# Patient Record
Sex: Female | Born: 1965 | Race: White | Hispanic: No | Marital: Married | Smoking: Former smoker
Health system: Southern US, Community
[De-identification: ages and names within clinical notes are randomized; demographics above are authoritative.]

## PROBLEM LIST (undated history)

## (undated) DIAGNOSIS — C50919 Malignant neoplasm of unspecified site of unspecified female breast: Secondary | ICD-10-CM

## (undated) DIAGNOSIS — K589 Irritable bowel syndrome without diarrhea: Secondary | ICD-10-CM

## (undated) DIAGNOSIS — Z5111 Encounter for antineoplastic chemotherapy: Secondary | ICD-10-CM

## (undated) DIAGNOSIS — D099 Carcinoma in situ, unspecified: Secondary | ICD-10-CM

## (undated) DIAGNOSIS — K219 Gastro-esophageal reflux disease without esophagitis: Secondary | ICD-10-CM

## (undated) DIAGNOSIS — R140 Abdominal distension (gaseous): Secondary | ICD-10-CM

## (undated) DIAGNOSIS — R5081 Fever presenting with conditions classified elsewhere: Secondary | ICD-10-CM

## (undated) DIAGNOSIS — R109 Unspecified abdominal pain: Secondary | ICD-10-CM

## (undated) DIAGNOSIS — K5909 Other constipation: Secondary | ICD-10-CM

## (undated) DIAGNOSIS — C569 Malignant neoplasm of unspecified ovary: Secondary | ICD-10-CM

## (undated) DIAGNOSIS — R160 Hepatomegaly, not elsewhere classified: Secondary | ICD-10-CM

## (undated) DIAGNOSIS — K5792 Diverticulitis of intestine, part unspecified, without perforation or abscess without bleeding: Secondary | ICD-10-CM

## (undated) DIAGNOSIS — R1900 Intra-abdominal and pelvic swelling, mass and lump, unspecified site: Secondary | ICD-10-CM

## (undated) DIAGNOSIS — Z1509 Genetic susceptibility to other malignant neoplasm: Secondary | ICD-10-CM

## (undated) DIAGNOSIS — K5090 Crohn's disease, unspecified, without complications: Secondary | ICD-10-CM

## (undated) DIAGNOSIS — A419 Sepsis, unspecified organism: Secondary | ICD-10-CM

## (undated) DIAGNOSIS — Z1501 Genetic susceptibility to malignant neoplasm of breast: Secondary | ICD-10-CM

## (undated) DIAGNOSIS — Z1502 Genetic susceptibility to malignant neoplasm of ovary: Secondary | ICD-10-CM

## (undated) DIAGNOSIS — D709 Neutropenia, unspecified: Secondary | ICD-10-CM

## (undated) HISTORY — DX: Carcinoma in situ, unspecified: D09.9

## (undated) HISTORY — DX: Malignant neoplasm of unspecified ovary: C56.9

## (undated) HISTORY — DX: Diverticulitis of intestine, part unspecified, without perforation or abscess without bleeding: K57.92

## (undated) HISTORY — DX: Other constipation: K59.09

## (undated) HISTORY — DX: Sepsis, unspecified organism: A41.9

## (undated) HISTORY — PX: HYSTERECTOMY ABDOMINAL WITH SALPINGECTOMY: SHX6725

## (undated) HISTORY — DX: Neutropenia, unspecified: D70.9

## (undated) HISTORY — DX: Encounter for antineoplastic chemotherapy: Z51.11

## (undated) HISTORY — DX: Genetic susceptibility to malignant neoplasm of ovary: Z15.02

## (undated) HISTORY — DX: Fever presenting with conditions classified elsewhere: R50.81

## (undated) HISTORY — DX: Intra-abdominal and pelvic swelling, mass and lump, unspecified site: R19.00

## (undated) HISTORY — DX: Genetic susceptibility to malignant neoplasm of breast: Z15.01

## (undated) HISTORY — PX: MASTECTOMY: SHX3

## (undated) HISTORY — DX: Gastro-esophageal reflux disease without esophagitis: K21.9

## (undated) HISTORY — DX: Abdominal distension (gaseous): R14.0

## (undated) HISTORY — PX: THORACOSCOPY: SUR1347

## (undated) HISTORY — DX: Unspecified abdominal pain: R10.9

## (undated) HISTORY — PX: PORTACATH PLACEMENT: SHX2246

## (undated) HISTORY — PX: TISSUE EXPANDER REMOVAL: SHX2531

## (undated) HISTORY — PX: TISSUE EXPANDER PLACEMENT: SHX2530

## (undated) HISTORY — DX: Genetic susceptibility to malignant neoplasm of breast: Z15.09

## (undated) HISTORY — PX: DILATION AND CURETTAGE OF UTERUS: SHX78

## (undated) HISTORY — PX: EXPLORATORY LAPAROTOMY: SUR591

## (undated) HISTORY — DX: Hepatomegaly, not elsewhere classified: R16.0

## (undated) HISTORY — PX: RECONSTRUCTION / CORRECTION OF NIPPLE / AEROLA: SUR1073

## (undated) HISTORY — PX: TUBAL LIGATION: SHX77

## (undated) HISTORY — DX: Malignant neoplasm of unspecified site of unspecified female breast: C50.919

## (undated) HISTORY — DX: Irritable bowel syndrome, unspecified: K58.9

## (undated) HISTORY — PX: LYMPH NODE BIOPSY: SHX201

## (undated) HISTORY — PX: COLONOSCOPY: SHX174

## (undated) HISTORY — DX: Crohn's disease, unspecified, without complications: K50.90

---

## 2002-08-28 ENCOUNTER — Ambulatory Visit (HOSPITAL_COMMUNITY): Admission: RE | Admit: 2002-08-28 | Discharge: 2002-08-28 | Payer: Self-pay

## 2011-02-14 ENCOUNTER — Encounter: Payer: Self-pay | Admitting: Emergency Medicine

## 2011-02-14 ENCOUNTER — Emergency Department (HOSPITAL_COMMUNITY)
Admission: EM | Admit: 2011-02-14 | Discharge: 2011-02-14 | Disposition: A | Discharge status: Home / Self Care | Payer: Self-pay | Attending: Emergency Medicine | Admitting: Emergency Medicine

## 2011-02-14 DIAGNOSIS — R221 Localized swelling, mass and lump, neck: Secondary | ICD-10-CM | POA: Insufficient documentation

## 2011-02-14 DIAGNOSIS — F17200 Nicotine dependence, unspecified, uncomplicated: Secondary | ICD-10-CM | POA: Insufficient documentation

## 2011-02-14 DIAGNOSIS — L299 Pruritus, unspecified: Secondary | ICD-10-CM | POA: Insufficient documentation

## 2011-02-14 DIAGNOSIS — IMO0001 Reserved for inherently not codable concepts without codable children: Secondary | ICD-10-CM | POA: Insufficient documentation

## 2011-02-14 DIAGNOSIS — R000 Tachycardia, unspecified: Secondary | ICD-10-CM | POA: Insufficient documentation

## 2011-02-14 DIAGNOSIS — L259 Unspecified contact dermatitis, unspecified cause: Secondary | ICD-10-CM | POA: Insufficient documentation

## 2011-02-14 DIAGNOSIS — R220 Localized swelling, mass and lump, head: Secondary | ICD-10-CM | POA: Insufficient documentation

## 2011-02-14 DIAGNOSIS — R002 Palpitations: Secondary | ICD-10-CM | POA: Insufficient documentation

## 2011-02-14 DIAGNOSIS — Z79899 Other long term (current) drug therapy: Secondary | ICD-10-CM | POA: Insufficient documentation

## 2011-02-14 DIAGNOSIS — L258 Unspecified contact dermatitis due to other agents: Secondary | ICD-10-CM

## 2011-02-14 MED ORDER — PREDNISONE 20 MG PO TABS
60.0000 mg | ORAL_TABLET | Freq: Once | ORAL | Status: AC
  Administered 2011-02-14: 60 mg via ORAL
  Filled 2011-02-14: qty 3

## 2011-02-14 MED ORDER — FAMOTIDINE 20 MG PO TABS
40.0000 mg | ORAL_TABLET | Freq: Once | ORAL | Status: AC
  Administered 2011-02-14: 40 mg via ORAL
  Filled 2011-02-14 (×2): qty 1

## 2011-02-14 MED ORDER — PREDNISONE 20 MG PO TABS: ORAL_TABLET | ORAL | Status: AC

## 2011-02-14 MED ORDER — FAMOTIDINE 20 MG PO TABS: 20.0000 mg | ORAL_TABLET | Freq: Two times a day (BID) | ORAL | Status: DC

## 2011-02-14 NOTE — ED Notes (Signed)
Pt reports seen at urgent care Monday for possible scabies. Pt used prescription and now feels like her body is on fire and fatigue. Pt tried benadryl without relief.

## 2011-02-14 NOTE — ED Provider Notes (Signed)
Medical screening examination/treatment/procedure(s) were performed by non-physician practitioner and as supervising physician I was immediately available for consultation/collaboration.   Veryl Speak, MD 02/14/11 2256

## 2011-02-14 NOTE — ED Provider Notes (Signed)
History     CSN: [id number] Arrival date & time: 02/14/2011  4:28 PM   First MD Initiated Contact with Patient 02/14/11 2035      Chief Complaint  Patient presents with  . Pruritis    (Consider location/radiation/quality/duration/timing/severity/associated sxs/prior treatment) HPI Comments: [name] was seen a week ago in urgent care for a rash on her right shoulder, which consisted of 2 small punctate lesions was treated with permethrin cream forced presumptive scabies, which she used she was also been treated for a fungal infection under her breasts with "trauma.  Time ointment, which is helping, but for the past 2 days has had generalized pruritus.  She feels like inside is on fire.  She is tachycardic has a headache and generally feels unwell.  She reports 6 months ago.  She was treated for an acute allergic reaction to an unknown substance.  Her mother, who is with her has significant allergies to environmental exposures.  The history is provided by the patient.    History reviewed. No pertinent past medical history.  History reviewed. No pertinent past surgical history.  No family history on file.  History  Substance Use Topics  . Smoking status: Current Everyday Smoker  . Smokeless tobacco: Not on file  . Alcohol Use: No    OB History    Grav Para Term Preterm Abortions TAB SAB Ect Mult Living                  Review of Systems  Constitutional: Negative.   HENT: Positive for facial swelling.   Eyes: Negative.   Respiratory: Negative for cough, chest tightness, shortness of breath and wheezing.   Cardiovascular: Positive for palpitations.  Gastrointestinal: Negative.   Genitourinary: Negative.   Musculoskeletal: Positive for myalgias.  Skin: Positive for color change.  Neurological: Negative.   Hematological: Negative.   Psychiatric/Behavioral: Negative.     Allergies  Review of patient's allergies indicates no known allergies.  Home Medications    Current Outpatient Rx  Name Route Sig Dispense Refill  . CLOTRIMAZOLE-BETAMETHASONE 1-0.05 % EX CREA Topical Apply 1 application topically 2 (two) times daily.      Marland Kitchen DIPHENHYDRAMINE HCL 25 MG PO CAPS Oral Take 25 mg by mouth every 4 (four) hours as needed. For itching and hives     . PERMETHRIN 5 % EX CREA Topical Apply 1 application topically once.      Marland Kitchen FAMOTIDINE 20 MG PO TABS Oral Take 1 tablet (20 mg total) by mouth 2 (two) times daily. 30 tablet 0  . PREDNISONE 20 MG PO TABS  3 Tabs PO Days 1-3, then 2 tabs PO Days 4-6, then 1 tab PO Day 7-9, then Half Tab PO Day 10-12 20 tablet 0    BP 157/86  Pulse 99  Temp(Src) 98.9 F (37.2 C) (Oral)  Resp 15  SpO2 99%  LMP 01/22/2011  Physical Exam  Constitutional: She is oriented to person, place, and time. She appears well-developed and well-nourished.  HENT:  Head: Normocephalic.  Eyes: EOM are normal.  Neck: Neck supple.  Cardiovascular: Normal rate and regular rhythm.   Pulmonary/Chest: Breath sounds normal.  Abdominal: Bowel sounds are normal.  Musculoskeletal: Normal range of motion. She exhibits no edema and no tenderness.  Neurological: She is oriented to person, place, and time.  Skin: No rash noted. There is erythema.       [name] .  Face appears puffy and red it is warm to the touch, as  well as the skin on her upper extremities, chest and abdomen    ED Course  Procedures (including critical care time)  Labs Reviewed - No data to display No results found. After administration of prednisone and Pepcid.  Patient is feeling better.  Will discharge home with prescriptions for both prednisone, Pepcid, and followup with allergist  1. Contact dermatitis due to other specified agents     I discussed with Ms. Owens at length.  The elimination of all cosmetic products change in soaps to [place] products to wash all of her clothing and linens before use and gradually add back one item at a time to help determine an exact  trigger for her allergic reaction  MDM  [name] for allergies        Garald Balding, NP 02/14/11 2101  Garald Balding, NP 02/14/11 2248  Garald Balding, NP 02/14/11 2250

## 2018-01-18 NOTE — Progress Notes (Signed)
Added pt's medication list per [place]

## 2018-01-23 ENCOUNTER — Encounter: Payer: Self-pay | Admitting: Gynecologic Oncology

## 2018-01-23 ENCOUNTER — Inpatient Hospital Stay: Payer: 59 | Attending: Gynecologic Oncology | Admitting: Gynecologic Oncology

## 2018-01-23 ENCOUNTER — Telehealth: Payer: Self-pay

## 2018-01-23 ENCOUNTER — Inpatient Hospital Stay: Payer: 59

## 2018-01-23 ENCOUNTER — Encounter: Payer: Self-pay | Admitting: Gastroenterology

## 2018-01-23 ENCOUNTER — Encounter: Payer: Self-pay | Admitting: Emergency Medicine

## 2018-01-23 VITALS — BP 133/85 | HR 76 | Temp 98.2°F | Resp 18 | Ht 64.5 in | Wt 177.0 lb

## 2018-01-23 DIAGNOSIS — Z90722 Acquired absence of ovaries, bilateral: Secondary | ICD-10-CM | POA: Diagnosis not present

## 2018-01-23 DIAGNOSIS — B029 Zoster without complications: Secondary | ICD-10-CM | POA: Diagnosis not present

## 2018-01-23 DIAGNOSIS — Z9221 Personal history of antineoplastic chemotherapy: Secondary | ICD-10-CM | POA: Diagnosis not present

## 2018-01-23 DIAGNOSIS — C569 Malignant neoplasm of unspecified ovary: Secondary | ICD-10-CM

## 2018-01-23 DIAGNOSIS — K5900 Constipation, unspecified: Secondary | ICD-10-CM | POA: Diagnosis not present

## 2018-01-23 DIAGNOSIS — Z87891 Personal history of nicotine dependence: Secondary | ICD-10-CM

## 2018-01-23 DIAGNOSIS — Z90710 Acquired absence of both cervix and uterus: Secondary | ICD-10-CM

## 2018-01-23 DIAGNOSIS — Z95828 Presence of other vascular implants and grafts: Secondary | ICD-10-CM

## 2018-01-23 MED ORDER — GABAPENTIN 300 MG PO CAPS: 300.0000 mg | ORAL_CAPSULE | Freq: Three times a day (TID) | ORAL | 3 refills | Status: DC

## 2018-01-23 MED ORDER — SODIUM CHLORIDE 0.9% FLUSH
10.0000 mL | INTRAVENOUS | Status: DC | PRN
  Administered 2018-01-23: 10 mL via INTRAVENOUS
  Filled 2018-01-23: qty 10

## 2018-01-23 MED ORDER — ESCITALOPRAM OXALATE 10 MG PO TABS: 10.0000 mg | ORAL_TABLET | Freq: Every day | ORAL | 3 refills | Status: DC

## 2018-01-23 MED ORDER — VALACYCLOVIR HCL 1 G PO TABS: 1000.0000 mg | ORAL_TABLET | Freq: Every day | ORAL | 3 refills | Status: DC

## 2018-01-23 MED ORDER — HEPARIN SOD (PORK) LOCK FLUSH 100 UNIT/ML IV SOLN
500.0000 [IU] | Freq: Once | INTRAVENOUS | Status: AC
  Administered 2018-01-23: 500 [IU] via INTRAVENOUS
  Filled 2018-01-23: qty 5

## 2018-01-23 NOTE — Patient Instructions (Signed)
Plan to have a CA 125 today and again in three months.  We will contact you with the results.  Plan to follow up with Dr. Denman George in six months or sooner if needed or new symptoms arise.    Please call our office in November or December 2019 at [phone] to schedule an appointment for April 2020.  Please call for any new symptoms such as abdominal bloating, fullness, weight loss, persistent cough, bleeding.

## 2018-01-23 NOTE — Progress Notes (Signed)
New Patient Note: Gyn-Onc  Transfer of care for the evaluation of Latoya Duarte 52 y.o. female  CC:  Chief Complaint  Patient presents with  . Malignant neoplasm of ovary, unspecified laterality Latoya Duarte)    Assessment/Plan:  Latoya Duarte  is a 52 y.o.  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  NED on physical exam.  Recommend CA 125 today and in 3 months to establish baseline trends here at this lab.  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 Can increase to BID if this does not help.  Change in bowel habit with constipation and narrowing of caliber of stools. [place] 2016[place].  I will see her back in 6 months.  HPI: Latoya Duarte a 52year old woman who was seen as a transfer of care in the follow-up of her prior diagnosis of stage IIIC ovarian cancer in the setting of a deleterious mutation of BRCA 1.   Her tumor history began in December 2014 when she was diagnosed with clinical carcinomatosis and was taken to the operating room for exploratory laparotomy total abdominal hysterectomy BSO omentectomy and debulking surgery.  She was staged to stage IIIc high-grade serous ovarian cancer with a positive left periaortic lymph node (the omentum was negative).  She completed 6 cycles of IV platinum and taxane based chemotherapy in May 2015.  A PET CT scan performed for increasing Ca1 25 in April 2016 revealed recurrent disease in the right chest specifically of the sub-pleura and pleura of T10.  Dr. LClovis Rileyperformed a localized resection of this lesion with removal of the right 10th rib with negative margins and she went on to receive 6 cycles of IV carboplatin at AUC 5 and Taxotere.  She then completed 143monthof  maintenance Olaparib and stopped this in December, 2017 due to toxicity of nausea.   She was NED since that time with a normal Ca1 25 and normal CT imaging.  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  She takes gabapentin for this.  She also experienced fatigue.  Since the summer 2019 she had experienced narrowing on the caliber of her stools with progressive constipation.  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  Current Meds:  Outpatient Encounter Medications as of 01/23/2018  Medication Sig  . doxycycline (VIBRAMYCIN) 100 MG capsule doxycycline hyclate 100 mg capsule  Take 1 capsule twice a day by oral route for 10 days.  . Marland Kitchenscitalopram (LEXAPRO) 10 MG tablet Take 1 tablet (10 mg total) by mouth daily.  . Marland Kitchenabapentin (NEURONTIN) 300 MG capsule Take 1 capsule (300 mg total) by mouth 3 (three) times daily.  . Marland Kitchenidocaine-prilocaine (EMLA) cream Apply a thick layer to port-a-cath site ~1 hour prior to use.  . ondansetron (ZOFRAN) 8 MG tablet TAKE ONE TABLET BY MOUTH EVERY 8 HOURS AS NEEDED FOR NAUSEA  . pyridOXINE (VITAMIN B-6) 100 MG tablet Take by mouth.  . valACYclovir (VALTREX) 1000 MG tablet Take 1 tablet (1,000 mg total) by mouth daily.  . [DISCONTINUED] escitalopram (LEXAPRO) 10 MG tablet Take 10 mg by mouth daily.  . [DISCONTINUED] gabapentin (NEURONTIN) 300 MG capsule Take by mouth 3 (three) times  daily.   . [DISCONTINUED] LORazepam (ATIVAN) 1 MG tablet Take 0.5 mg by mouth 2 (two) times daily. 0.5 mg by mouth twice a day  . [DISCONTINUED] oxyCODONE-acetaminophen (PERCOCET/ROXICET) 5-325 MG tablet Take by mouth every 4 (four) hours as needed.   . [DISCONTINUED] prochlorperazine (COMPAZINE) 10 MG tablet TAKE ONE TABLET BY MOUTH EVERY 6 HOURS AS NEEDED FOR NAUSEA  . [DISCONTINUED] ranitidine (ZANTAC) 150 MG capsule Take by mouth.   No  facility-administered encounter medications on file as of 01/23/2018.     Allergy:  Allergies  Allergen Reactions  . Ciprofloxacin Rash  . Latex Rash  . Tape Rash    Irritates skin    Social Hx:   Social History   Socioeconomic History  . Marital status: Married    Spouse name: Not on file  . Number of children: Not on file  . Years of education: Not on file  . Highest education level: Not on file  Occupational History  . Not on file  Social Needs  . Financial resource strain: Not on file  . Food insecurity:    Worry: Not on file    Inability: Not on file  . Transportation needs:    Medical: Not on file    Non-medical: Not on file  Tobacco Use  . Smoking status: Former Smoker    Packs/day: 1.00    Years: 5.00    Pack years: 5.00    Types: Cigarettes  . Smokeless tobacco: Never Used  Substance and Sexual Activity  . Alcohol use: Never    Frequency: Never  . Drug use: No  . Sexual activity: Not Currently    Birth control/protection: Surgical  Lifestyle  . Physical activity:    Days per week: Not on file    Minutes per session: Not on file  . Stress: Not on file  Relationships  . Social connections:    Talks on phone: Not on file    Gets together: Not on file    Attends religious service: Not on file    Active member of club or organization: Not on file    Attends meetings of clubs or organizations: Not on file    Relationship status: Not on file  . Intimate partner violence:    Fear of current or ex partner: Not on file    Emotionally abused: Not on file    Physically abused: Not on file    Forced sexual activity: Not on file  Other Topics Concern  . Not on file  Social History Narrative   ** Merged History Encounter **        Past Surgical Hx:  Past Surgical History:  Procedure Laterality Date  . COLONOSCOPY    . DILATION AND CURETTAGE OF UTERUS    . EXPLORATORY LAPAROTOMY    . HYSTERECTOMY ABDOMINAL WITH SALPINGECTOMY    . LYMPH NODE BIOPSY     . MASTECTOMY    . PORTACATH PLACEMENT    . RECONSTRUCTION / CORRECTION OF NIPPLE / AEROLA    . THORACOSCOPY    . TISSUE EXPANDER PLACEMENT    . TISSUE EXPANDER REMOVAL    . TUBAL LIGATION      Past Medical Hx:  Past Medical History:  Diagnosis Date  . Abdominal bloating   . Abdominal pain   . BRCA1 genetic carrier   . Breast cancer ([place])   . Carcinoma in situ   . Chronic constipation   . Crohn's disease ([place])   . Diverticulitis   .  Encounter for antineoplastic chemotherapy   . GERD (gastroesophageal reflux disease)   . Hepatomegaly   . IBS (irritable bowel syndrome)   . Malignant neoplasm of ovary ([place])   . 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 Ovarian cancer genetic susceptibility   . Pelvic mass   . Sepsis [facility])     Past Gynecological History:  see HPI. No LMP recorded.  Family Hx:  Family History  Problem Relation Age of Onset  . Hypertension Mother   . Diabetes Father   . Hypertension Father   . Breast cancer Maternal Grandmother   . Ovarian cancer Paternal Aunt   . Lung cancer Paternal Grandfather   . Ovarian cancer Paternal Aunt   . Ovarian cancer Paternal Aunt   . Ovarian cancer Paternal Aunt   . Colon cancer Cousin   . Pancreatic cancer Cousin     Review of Systems:  Constitutional  Feels well,    ENT Normal appearing ears and nares bilaterally Skin/Breast  + rash scaly, painful, nonpruritic along dermatome of T10, classic for shingles Cardiovascular  No chest pain, shortness of breath, or edema  Pulmonary  No cough or wheeze.  Gastro Intestinal  No nausea, vomitting, or diarrhoea. No bright red blood per rectum, no abdominal pain, change in bowel movement, or constipation.  Genito Urinary  No frequency, urgency, dysuria, no bleeding Musculo Skeletal  No myalgia, arthralgia, joint swelling or pain  Neurologic  + neuropathy of feet Psychology  No depression, anxiety, insomnia.   Vitals:  Blood pressure 133/85, pulse  76, temperature 98.2 F (36.8 C), temperature source Oral, resp. rate 18, height 5' 4.5" (1.638 m), weight 177 lb (80.3 kg), SpO2 100 %.  Physical Exam: WD in NAD Neck  Supple NROM, without any enlargements.  Lymph Node Survey No cervical supraclavicular or inguinal adenopathy Cardiovascular  Pulse normal rate, regularity and rhythm. S1 and S2 normal.  Lungs  Clear to auscultation bilateraly, without wheezes/crackles/rhonchi. Good air movement.  Skin  + scaly rash with erythematous lesions along distribution of T10 Psychiatry  Alert and oriented to person, place, and time  Abdomen  Normoactive bowel sounds, abdomen soft, non-tender and mildly obese withevidence of hernia in the umbilicus. Back No CVA tenderness Genito Urinary  Vulva/vagina: Normal external female genitalia.  No lesions. No discharge or bleeding.  Bladder/urethra:  No lesions or masses, well supported bladder  Vagina: normal, surgically absent uterus and cervix  Adnexa:  masses. Rectal  Good tone, no masses no cul de sac nodularity.  Extremities  No bilateral cyanosis, clubbing or edema.  30 minutes spent in review of patient's prior records including notes, imaging, labs, pathology.   Thereasa Solo, MD  01/23/2018, 10:38 AM

## 2018-01-23 NOTE — Telephone Encounter (Signed)
XR CHEST APPORTABLE5/20/2016 [facility] Result Impression   Patchy opacity is present at the left costophrenic angle which could represent atelectasis or pneumonia. Please follow to clearing. Trace left effusion cannot be completely excluded.  Result Narrative  XRAY CHEST (FRONTAL VIEW, PORTABLE), Aug 22, 2014 06:08:05 PM . INDICATION: R chest wall reconstruction and Chest tubeC56.9 Malignant neoplasm of ovary, uns  COMPARISON: [facility] from May 06, 2014 CT scan . FINDINGS: Port-A-Cath tip terminates at the cavoatrial junction. The cardiac silhouette mediastinum, and pulmonary vasculature are within normal limits. Patchy opacity is present at the left costophrenic angle which could represent atelectasis or pneumonia. Please follow to clearing. A trace left effusion cannot be completely excluded. No pneumothorax. Osseous structures are unremarkable. .  Other Result Information  Interface, Rad Results In - 08/23/2014  9:41 AM EDT XRAY CHEST (FRONTAL VIEW, PORTABLE), Aug 22, 2014 06:08:05 PM . INDICATION:  R chest wall reconstruction and Chest tubeC56.9 Malignant neoplasm of ovary, uns  COMPARISON: [facility] from May 06, 2014 CT scan . FINDINGS: Port-A-Cath tip terminates at the cavoatrial junction. The cardiac silhouette mediastinum, and pulmonary vasculature are within normal limits. Patchy opacity is present at the left costophrenic angle which could represent atelectasis or pneumonia. Please follow to clearing. A trace left effusion cannot be completely excluded. No pneumothorax. Osseous structures are unremarkable. . CONCLUSION:  Patchy opacity is present at the left costophrenic angle which could represent atelectasis or pneumonia. Please follow to clearing. Trace left effusion cannot be completely excluded.  Status Results Details   Encounter Summary

## 2018-01-24 LAB — CA 125: Cancer Antigen (CA) 125: 16.9 U/mL (ref 0.0–38.1)

## 2018-01-26 ENCOUNTER — Encounter: Payer: Self-pay | Admitting: Gynecologic Oncology

## 2018-02-08 ENCOUNTER — Encounter: Payer: Self-pay | Admitting: Gynecologic Oncology

## 2018-02-09 ENCOUNTER — Telehealth: Payer: Self-pay

## 2018-02-09 ENCOUNTER — Other Ambulatory Visit: Payer: Self-pay | Admitting: Gynecologic Oncology

## 2018-02-09 DIAGNOSIS — G8928 Other chronic postprocedural pain: Secondary | ICD-10-CM

## 2018-02-09 MED ORDER — OXYCODONE-ACETAMINOPHEN 5-325 MG PO TABS: 1.0000 | ORAL_TABLET | Freq: Four times a day (QID) | ORAL | 0 refills | Status: DC | PRN

## 2018-02-09 NOTE — Telephone Encounter (Signed)
Outgoing call to patient per Joylene John NP - 'prescription was refilled, 30 tablets refill but then prescription needs to be from PCP, is pain acute or chronic?'  Pt verbalized her pain as chronic low middle of back and right side due to had to have a rib removed in past.  Pt voiced understanding, reports doesn't have PCP at this time but voiced understanding regarding further pain med prescription through a PCP.  Notified Joylene John NP. No other needs at this time per pt.

## 2018-02-09 NOTE — Progress Notes (Signed)
See RN note.

## 2018-02-21 ENCOUNTER — Ambulatory Visit: Payer: Self-pay | Admitting: Gastroenterology

## 2018-03-03 ENCOUNTER — Telehealth: Payer: Self-pay | Admitting: *Deleted

## 2018-03-03 NOTE — Telephone Encounter (Signed)
Followed up on the GI consult. Patient called and canceled the appt, note stated that the patient will call back to reschedule

## 2018-03-06 ENCOUNTER — Inpatient Hospital Stay: Payer: 59

## 2018-03-06 ENCOUNTER — Telehealth: Payer: Self-pay | Admitting: *Deleted

## 2018-03-06 NOTE — Telephone Encounter (Signed)
Patient called and reschedule her appt from today to next week. Patient sick

## 2018-03-15 ENCOUNTER — Inpatient Hospital Stay: Payer: 59 | Attending: Gynecologic Oncology

## 2018-03-15 DIAGNOSIS — Z95828 Presence of other vascular implants and grafts: Secondary | ICD-10-CM

## 2018-03-15 DIAGNOSIS — Z452 Encounter for adjustment and management of vascular access device: Secondary | ICD-10-CM | POA: Insufficient documentation

## 2018-03-15 DIAGNOSIS — C569 Malignant neoplasm of unspecified ovary: Secondary | ICD-10-CM | POA: Diagnosis present

## 2018-03-15 MED ORDER — HEPARIN SOD (PORK) LOCK FLUSH 100 UNIT/ML IV SOLN
500.0000 [IU] | Freq: Once | INTRAVENOUS | Status: AC
  Administered 2018-03-15: 500 [IU]
  Filled 2018-03-15: qty 5

## 2018-03-15 MED ORDER — SODIUM CHLORIDE 0.9% FLUSH
10.0000 mL | Freq: Once | INTRAVENOUS | Status: AC
  Administered 2018-03-15: 10 mL
  Filled 2018-03-15: qty 10

## 2018-04-17 ENCOUNTER — Inpatient Hospital Stay: Payer: 59 | Attending: Gynecologic Oncology

## 2018-04-17 ENCOUNTER — Inpatient Hospital Stay: Payer: 59

## 2018-04-17 DIAGNOSIS — Z90722 Acquired absence of ovaries, bilateral: Secondary | ICD-10-CM | POA: Insufficient documentation

## 2018-04-17 DIAGNOSIS — Z9221 Personal history of antineoplastic chemotherapy: Secondary | ICD-10-CM | POA: Diagnosis not present

## 2018-04-17 DIAGNOSIS — Z90710 Acquired absence of both cervix and uterus: Secondary | ICD-10-CM | POA: Insufficient documentation

## 2018-04-17 DIAGNOSIS — K5900 Constipation, unspecified: Secondary | ICD-10-CM | POA: Diagnosis not present

## 2018-04-17 DIAGNOSIS — C569 Malignant neoplasm of unspecified ovary: Secondary | ICD-10-CM | POA: Insufficient documentation

## 2018-04-17 DIAGNOSIS — B029 Zoster without complications: Secondary | ICD-10-CM | POA: Insufficient documentation

## 2018-04-17 DIAGNOSIS — Z95828 Presence of other vascular implants and grafts: Secondary | ICD-10-CM

## 2018-04-17 DIAGNOSIS — Z87891 Personal history of nicotine dependence: Secondary | ICD-10-CM | POA: Insufficient documentation

## 2018-04-17 MED ORDER — SODIUM CHLORIDE 0.9% FLUSH
10.0000 mL | Freq: Once | INTRAVENOUS | Status: AC
  Administered 2018-04-17: 10 mL
  Filled 2018-04-17: qty 10

## 2018-04-17 MED ORDER — HEPARIN SOD (PORK) LOCK FLUSH 100 UNIT/ML IV SOLN
500.0000 [IU] | Freq: Once | INTRAVENOUS | Status: AC
  Administered 2018-04-17: 500 [IU]
  Filled 2018-04-17: qty 5

## 2018-04-18 LAB — CA 125: Cancer Antigen (CA) 125: 21.8 U/mL (ref 0.0–38.1)

## 2018-04-19 ENCOUNTER — Encounter: Payer: Self-pay | Admitting: Gynecologic Oncology

## 2018-04-19 ENCOUNTER — Telehealth: Payer: Self-pay | Admitting: *Deleted

## 2018-04-19 ENCOUNTER — Other Ambulatory Visit: Payer: Self-pay | Admitting: Gynecologic Oncology

## 2018-04-19 DIAGNOSIS — G620 Drug-induced polyneuropathy: Secondary | ICD-10-CM

## 2018-04-19 DIAGNOSIS — C569 Malignant neoplasm of unspecified ovary: Secondary | ICD-10-CM

## 2018-04-19 DIAGNOSIS — T451X5A Adverse effect of antineoplastic and immunosuppressive drugs, initial encounter: Principal | ICD-10-CM

## 2018-04-19 MED ORDER — GABAPENTIN 300 MG PO CAPS: 300.0000 mg | ORAL_CAPSULE | Freq: Three times a day (TID) | ORAL | 3 refills | Status: AC

## 2018-04-19 MED ORDER — ESCITALOPRAM OXALATE 10 MG PO TABS: 10.0000 mg | ORAL_TABLET | Freq: Every day | ORAL | 3 refills | Status: AC

## 2018-04-19 NOTE — Telephone Encounter (Signed)
Called and scheduled the patient for a follow up and lab appt for April 1st

## 2018-04-20 ENCOUNTER — Other Ambulatory Visit: Payer: Self-pay | Admitting: Gynecologic Oncology

## 2018-04-20 ENCOUNTER — Encounter: Payer: Self-pay | Admitting: Gynecologic Oncology

## 2018-04-20 DIAGNOSIS — C569 Malignant neoplasm of unspecified ovary: Secondary | ICD-10-CM

## 2018-04-20 NOTE — Progress Notes (Signed)
CA 125 increasing.  Patient with complaints of back pain and pain at the right chest, site of previous recurrence.    Hx: Stage IIIC ovarian cancer in the setting of a deleterious mutation of BRCA 1. Hx of having a PET CT scan performed for increasing Ca1 25 in April 2016 revealed recurrent disease in the right chest specifically of the sub-pleura and pleura of T10.  

## 2018-04-24 ENCOUNTER — Telehealth: Payer: Self-pay | Admitting: Oncology

## 2018-04-24 NOTE — Telephone Encounter (Signed)
Called Tedra Coupe with [facility] and requested powersharing PET scan from 07/08/2014 from [facility].

## 2018-04-25 ENCOUNTER — Ambulatory Visit
Admission: RE | Admit: 2018-04-25 | Discharge: 2018-04-25 | Disposition: A | Discharge status: Home / Self Care | Payer: Self-pay | Source: Ambulatory Visit | Attending: Gynecologic Oncology | Admitting: Gynecologic Oncology

## 2018-04-25 ENCOUNTER — Other Ambulatory Visit: Payer: Self-pay | Admitting: Oncology

## 2018-04-25 DIAGNOSIS — C569 Malignant neoplasm of unspecified ovary: Secondary | ICD-10-CM

## 2018-05-02 ENCOUNTER — Encounter: Payer: Self-pay | Admitting: Gynecologic Oncology

## 2018-05-03 ENCOUNTER — Telehealth: Payer: Self-pay | Admitting: Oncology

## 2018-05-03 ENCOUNTER — Encounter (HOSPITAL_COMMUNITY): Payer: 59

## 2018-05-03 NOTE — Telephone Encounter (Signed)
Ok thanks. Radiology canceled the scan, waiting for approval

## 2018-05-03 NOTE — Telephone Encounter (Signed)
Left a message for patient regarding her PET scan being rescheduled to 05/11/18 at 3 pm.  Requested a return call.

## 2018-05-03 NOTE — Telephone Encounter (Signed)
Have you heard to get approval for her scan? She was the one you needed the insurance card for?

## 2018-05-11 ENCOUNTER — Encounter (HOSPITAL_COMMUNITY): Payer: Self-pay

## 2018-05-11 ENCOUNTER — Encounter (HOSPITAL_COMMUNITY)
Admission: RE | Admit: 2018-05-11 | Discharge: 2018-05-11 | Disposition: A | Discharge status: Home / Self Care | Payer: PRIVATE HEALTH INSURANCE | Source: Ambulatory Visit | Attending: Gynecologic Oncology | Admitting: Gynecologic Oncology

## 2018-05-11 DIAGNOSIS — C569 Malignant neoplasm of unspecified ovary: Secondary | ICD-10-CM | POA: Insufficient documentation

## 2018-05-11 LAB — GLUCOSE, CAPILLARY: Glucose-Capillary: 85 mg/dL (ref 70–99)

## 2018-05-11 MED ORDER — FLUDEOXYGLUCOSE F - 18 (FDG) INJECTION
9.6000 | Freq: Once | INTRAVENOUS | Status: AC
  Administered 2018-05-11: 9.6 via INTRAVENOUS

## 2018-05-15 ENCOUNTER — Telehealth: Payer: Self-pay | Admitting: Gynecologic Oncology

## 2018-05-15 ENCOUNTER — Encounter: Payer: Self-pay | Admitting: Gynecologic Oncology

## 2018-05-15 NOTE — Telephone Encounter (Signed)
Attempted to call patient to discuss PET scan results.  Left message asking her to please call the office.

## 2018-05-15 NOTE — Telephone Encounter (Signed)
Patient returned call to office.  Discussed PET scan results.  Patient to come into the office tomorrow to discuss.

## 2018-05-16 ENCOUNTER — Inpatient Hospital Stay: Payer: 59 | Attending: Gynecologic Oncology | Admitting: Gynecology

## 2018-05-16 ENCOUNTER — Encounter: Payer: Self-pay | Admitting: Gynecology

## 2018-05-16 VITALS — BP 115/75 | HR 90 | Temp 98.3°F | Resp 18 | Ht 64.5 in | Wt 170.0 lb

## 2018-05-16 DIAGNOSIS — R971 Elevated cancer antigen 125 [CA 125]: Secondary | ICD-10-CM | POA: Diagnosis not present

## 2018-05-16 DIAGNOSIS — B029 Zoster without complications: Secondary | ICD-10-CM | POA: Insufficient documentation

## 2018-05-16 DIAGNOSIS — Z90710 Acquired absence of both cervix and uterus: Secondary | ICD-10-CM | POA: Insufficient documentation

## 2018-05-16 DIAGNOSIS — Z87891 Personal history of nicotine dependence: Secondary | ICD-10-CM | POA: Diagnosis not present

## 2018-05-16 DIAGNOSIS — Z90722 Acquired absence of ovaries, bilateral: Secondary | ICD-10-CM | POA: Insufficient documentation

## 2018-05-16 DIAGNOSIS — C569 Malignant neoplasm of unspecified ovary: Secondary | ICD-10-CM | POA: Diagnosis not present

## 2018-05-16 DIAGNOSIS — Z9221 Personal history of antineoplastic chemotherapy: Secondary | ICD-10-CM | POA: Diagnosis not present

## 2018-05-16 DIAGNOSIS — R948 Abnormal results of function studies of other organs and systems: Secondary | ICD-10-CM | POA: Diagnosis not present

## 2018-05-16 DIAGNOSIS — G8929 Other chronic pain: Secondary | ICD-10-CM | POA: Insufficient documentation

## 2018-05-16 DIAGNOSIS — M549 Dorsalgia, unspecified: Secondary | ICD-10-CM | POA: Insufficient documentation

## 2018-05-16 MED ORDER — IBUPROFEN 600 MG PO TABS: 600.0000 mg | ORAL_TABLET | Freq: Four times a day (QID) | ORAL | 0 refills | Status: AC | PRN

## 2018-05-16 NOTE — Patient Instructions (Signed)
We will reach out to the radiologist to see if a biopsy can be performed of the suspicious lymph nodes.  If it is unsafe to biopsy in that area, the plan will be to repeat the PET scan in two months.  You can take the ibuprofen for your back pain and use a heating pad.  Take the ibuprofen with food. Call our office if your pain worsens or persists.  Please call for any questions or concerns.

## 2018-05-16 NOTE — Progress Notes (Signed)
New Patient Note: Gyn-Onc  Transfer of care for the evaluation of Latoya Duarte 53 y.o. female  CC:  Chief Complaint  Patient presents with  . Malignant neoplasm of ovary, unspecified laterality (HCC)    Assessment/Plan:  Latoya Duarte  is a 53 y.o.  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  Due to a slight elevation in her Ca1 25 value a PET scan was obtained on May 12, 2018.  A number of new findings have been identified that are suspicious for metastatic disease.  Had a lengthy discussion with the patient and her sister detailing all of the findings listed in the interval history.  We will consult interventional radiology and asked them to consider whether a biopsy of 1 of these lesions would be achieved.  If that is not possible then I would recommend we repeat the PET scan in approximately 2 months.  Interval history: Patient returns today with her sister to review recent PET scan obtained because the patient's Ca1 25 became slightly more elevated.  At her initial visit with us (January 23, 2018) Ca1 25 value was 16.9 units/mL.  Repeat Ca1 25 on April 17, 2018 returned as 21.8 units/mL.  Subsequently a PET scan was obtained showing a number of new sites that were hypermetabolic.  Careful review of the current PET scan with a PET scan obtained July 08, 2014 at [facility] revealed the following:  Neck: C1 measuring 7 x 4 mm with an SUV of 2.5.  Previously 6 x 4 mm (no change)  Level 1B lymph node measuring 8 mm with an SUV of 5.4.  Previously 4 mm (concerning for metastatic disease)  Subcarinal lymph node measuring 9 mm with an SUV of 7.5.  Not present previously.  (Concerning for metastatic disease)  Right hilar lymph node measuring 7 mm with an SUV of 5.5.  Not present previously (concerning for  metastatic disease)  There is increased activity in the superior vena cava and right atrium consistent with fibrin sheath on her Port-A-Cath tip  Right upper lobe lesion measuring 9 mm with groundglass appearance.  SUV of 2.5 (not previously present)  Right lateral chest wall with 7 mm lesion with an SUV of 2.0.  This lesion was present previously and is not changed  Right posterior chest wall consistent with granulation/scarring from prior rib resection although close attention to follow-up is recommended.    The patient complains of some right chest wall pain and spine pain.  She is not using any pain medication for these complaints although she uses a heating pad to her chest wall.  Other wise she is asymptomatic.   HPI: Latoya Duarte is a 52 year old woman who was seen as a transfer of care in the follow-up of her prior diagnosis of stage IIIC ovarian cancer in the setting of a deleterious mutation of BRCA 1.   Her tumor history began in December 2014 when she was diagnosed with clinical carcinomatosis and was taken to the operating room for exploratory laparotomy total abdominal hysterectomy BSO omentectomy and debulking surgery.  She was staged to stage IIIc high-grade serous ovarian cancer with a positive left periaortic lymph node (the omentum was negative).  She completed 6 cycles of IV platinum and taxane based chemotherapy in May 2015.  A PET CT scan performed for increasing   Ca1 25 in April 2016 revealed recurrent disease in the right chest specifically of the sub-pleura and pleura of T10.  Dr. Clovis Riley performed a localized resection of this lesion with removal of the right 10th rib with negative margins and she went on to receive 6 cycles of IV carboplatin at AUC 5 and Taxotere.  She then completed 103month of maintenance Olaparib and stopped this in December, 2017 due to toxicity of nausea.   She was NED since that time with a normal Ca1 25 and normal CT imaging.  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  She takes gabapentin for this.  She also experienced fatigue.  Since the summer 2019 she had experienced narrowing on the caliber of her stools with progressive constipation.  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  Current Meds:  Outpatient Encounter Medications as of 05/16/2018  Medication Sig  . albuterol (VENTOLIN HFA) 108 (90 Base) MCG/ACT inhaler Ventolin HFA 90 mcg/actuation aerosol inhaler  Inhale 2 puffs every 4 hours by inhalation route.  . benzonatate (TESSALON PERLES) 100 MG capsule Tessalon Perles 100 mg capsule  Take 1 capsule 3 times a day by oral route for 5 days.  . cefUROXime (CEFTIN) 500 MG tablet cefuroxime axetil 500 mg tablet  Take 1 tablet every 12 hours by oral route for 10 days.  .Marland Kitchendoxycycline (VIBRA-TABS) 100 MG tablet doxycycline hyclate 100 mg tablet  Take 1 tablet twice a day by oral route for 10 days.  .Marland Kitchenescitalopram (LEXAPRO) 10 MG tablet Take 1 tablet (10 mg total) by mouth daily.  .Marland Kitchengabapentin (NEURONTIN) 300 MG capsule Take 1 capsule (300 mg total) by mouth 3 (three) times daily.  .Marland Kitchenlidocaine-prilocaine (EMLA) cream Apply a thick layer to port-a-cath site ~1 hour prior to use.  . Metoclopramide HCl 5 MG TBDP metoclopramide 5 mg disintegrating tablet  Take 1 tablet 4 times a day by oral route for 2 days.  . ondansetron (ZOFRAN) 8 MG tablet TAKE ONE TABLET BY MOUTH EVERY 8 HOURS AS NEEDED FOR NAUSEA  . oxyCODONE-acetaminophen (PERCOCET/ROXICET) 5-325 MG tablet Take 1 tablet by mouth every 6 (six) hours as needed for severe pain.  .Marland Kitchenpermethrin (ELIMITE) 5 % cream Apply 1 application topically once.    . pyridOXINE (VITAMIN B-6) 100 MG tablet Take by mouth.  . ranitidine (ZANTAC) 150 MG tablet Zantac 150 mg tablet  Take 1 tablet twice a day by oral route as needed.  . valACYclovir (VALTREX) 1000 MG  tablet Take 1 tablet (1,000 mg total) by mouth daily.   No facility-administered encounter medications on file as of 05/16/2018.     Allergy:  Allergies  Allergen Reactions  . Ciprofloxacin Rash  . Latex Rash  . Tape Rash    Irritates skin    Social Hx:   Social History   Socioeconomic History  . Marital status: Married    Spouse name: Not on file  . Number of children: Not on file  . Years of education: Not on file  . Highest education level: Not on file  Occupational History  . Not on file  Social Needs  . Financial resource strain: Not on file  . Food insecurity:    Worry: Not on file    Inability: Not on file  . Transportation needs:    Medical: Not on file    Non-medical: Not on file  Tobacco  Use  . Smoking status: Former Smoker    Packs/day: 1.00    Years: 5.00    Pack years: 5.00    Types: Cigarettes  . Smokeless tobacco: Never Used  Substance and Sexual Activity  . Alcohol use: Never    Frequency: Never  . Drug use: No  . Sexual activity: Not Currently    Birth control/protection: Surgical  Lifestyle  . Physical activity:    Days per week: Not on file    Minutes per session: Not on file  . Stress: Not on file  Relationships  . Social connections:    Talks on phone: Not on file    Gets together: Not on file    Attends religious service: Not on file    Active member of club or organization: Not on file    Attends meetings of clubs or organizations: Not on file    Relationship status: Not on file  . Intimate partner violence:    Fear of current or ex partner: Not on file    Emotionally abused: Not on file    Physically abused: Not on file    Forced sexual activity: Not on file  Other Topics Concern  . Not on file  Social History Narrative   ** Merged History Encounter **        Past Surgical Hx:  Past Surgical History:  Procedure Laterality Date  . COLONOSCOPY    . DILATION AND CURETTAGE OF UTERUS    . EXPLORATORY LAPAROTOMY    .  HYSTERECTOMY ABDOMINAL WITH SALPINGECTOMY    . LYMPH NODE BIOPSY    . MASTECTOMY    . PORTACATH PLACEMENT    . RECONSTRUCTION / CORRECTION OF NIPPLE / AEROLA    . THORACOSCOPY    . TISSUE EXPANDER PLACEMENT    . TISSUE EXPANDER REMOVAL    . TUBAL LIGATION      Past Medical Hx:  Past Medical History:  Diagnosis Date  . Abdominal bloating   . Abdominal pain   . BRCA1 genetic carrier   . Breast cancer (HCC)   . Carcinoma in situ   . Chronic constipation   . Crohn's disease (HCC)   . Diverticulitis   . Encounter for antineoplastic chemotherapy   . GERD (gastroesophageal reflux disease)   . Hepatomegaly   . IBS (irritable bowel syndrome)   . Malignant neoplasm of ovary (HCC)   . Neutropenic fever (HCC)   . Neutropenic fever (HCC)   . Ovarian cancer genetic susceptibility   . Pelvic mass   . Sepsis (HCC)     Past Gynecological History:  see HPI. Patient's last menstrual period was 01/22/2011.  Family Hx:  Family History  Problem Relation Age of Onset  . Hypertension Mother   . Diabetes Father   . Hypertension Father   . Breast cancer Maternal Grandmother   . Ovarian cancer Paternal Aunt   . Lung cancer Paternal Grandfather   . Ovarian cancer Paternal Aunt   . Ovarian cancer Paternal Aunt   . Ovarian cancer Paternal Aunt   . Colon cancer Cousin   . Pancreatic cancer Cousin     Review of Systems:  Constitutional  Feels well,    ENT Normal appearing ears and nares bilaterally Skin/Breast  + rash scaly, painful, nonpruritic along dermatome of T10, classic for shingles Cardiovascular  No chest pain, shortness of breath, or edema  Pulmonary  No cough or wheeze.  Gastro Intestinal  No nausea, vomitting, or diarrhoea. No bright   red blood per rectum, no abdominal pain, change in bowel movement, or constipation.  Genito Urinary  No frequency, urgency, dysuria, no bleeding Musculo Skeletal  No myalgia, arthralgia, joint swelling or pain  Neurologic  + neuropathy  of feet Psychology  No depression, anxiety, insomnia.   Vitals:  Blood pressure 115/75, pulse 90, temperature 98.3 F (36.8 C), temperature source Oral, resp. rate 18, height 5' 4.5" (1.638 m), weight 170 lb (77.1 kg), last menstrual period 01/22/2011, SpO2 100 %.  30 minutes spent in review of patient's prior records including notes, imaging, labs, pathology and correlating the 2016 PET scan with the current PET scan.Marti Sleigh, MD  05/16/2018, 12:11 PM

## 2018-05-19 ENCOUNTER — Encounter: Payer: Self-pay | Admitting: Gynecologic Oncology

## 2018-05-22 ENCOUNTER — Encounter: Payer: Self-pay | Admitting: Gynecologic Oncology

## 2018-05-24 ENCOUNTER — Other Ambulatory Visit: Payer: Self-pay | Admitting: Student

## 2018-05-24 ENCOUNTER — Other Ambulatory Visit: Payer: Self-pay | Admitting: Radiology

## 2018-05-25 ENCOUNTER — Ambulatory Visit (HOSPITAL_COMMUNITY)
Admission: RE | Admit: 2018-05-25 | Discharge: 2018-05-25 | Disposition: A | Discharge status: Home / Self Care | Payer: PRIVATE HEALTH INSURANCE | Source: Ambulatory Visit | Attending: Gynecologic Oncology | Admitting: Gynecologic Oncology

## 2018-05-25 ENCOUNTER — Encounter (HOSPITAL_COMMUNITY): Payer: Self-pay

## 2018-05-25 ENCOUNTER — Other Ambulatory Visit: Payer: Self-pay | Admitting: Gynecologic Oncology

## 2018-05-25 DIAGNOSIS — K5792 Diverticulitis of intestine, part unspecified, without perforation or abscess without bleeding: Secondary | ICD-10-CM | POA: Diagnosis not present

## 2018-05-25 DIAGNOSIS — Z87891 Personal history of nicotine dependence: Secondary | ICD-10-CM | POA: Diagnosis not present

## 2018-05-25 DIAGNOSIS — R948 Abnormal results of function studies of other organs and systems: Secondary | ICD-10-CM | POA: Insufficient documentation

## 2018-05-25 DIAGNOSIS — K5090 Crohn's disease, unspecified, without complications: Secondary | ICD-10-CM | POA: Diagnosis not present

## 2018-05-25 LAB — CBC
HCT: 45 % (ref 36.0–46.0)
Hemoglobin: 14.3 g/dL (ref 12.0–15.0)
MCH: 27.2 pg (ref 26.0–34.0)
MCHC: 31.8 g/dL (ref 30.0–36.0)
MCV: 85.7 fL (ref 80.0–100.0)
NRBC: 0 % (ref 0.0–0.2)
Platelets: 265 10*3/uL (ref 150–400)
RBC: 5.25 MIL/uL — ABNORMAL HIGH (ref 3.87–5.11)
RDW: 13.7 % (ref 11.5–15.5)
WBC: 8.2 10*3/uL (ref 4.0–10.5)

## 2018-05-25 LAB — PROTIME-INR
INR: 1
Prothrombin Time: 13.2 seconds (ref 11.4–15.2)

## 2018-05-25 MED ORDER — HEPARIN SOD (PORK) LOCK FLUSH 100 UNIT/ML IV SOLN
500.0000 [IU] | Freq: Once | INTRAVENOUS | Status: AC
  Administered 2018-05-25: 500 [IU] via INTRAVENOUS
  Filled 2018-05-25: qty 5

## 2018-05-25 MED ORDER — MIDAZOLAM HCL 2 MG/2ML IJ SOLN
INTRAMUSCULAR | Status: AC
  Filled 2018-05-25: qty 2

## 2018-05-25 MED ORDER — SODIUM CHLORIDE 0.9 % IV SOLN: INTRAVENOUS | Status: DC

## 2018-05-25 MED ORDER — FENTANYL CITRATE (PF) 100 MCG/2ML IJ SOLN
INTRAMUSCULAR | Status: AC
  Filled 2018-05-25: qty 2

## 2018-05-25 MED ORDER — HEPARIN SOD (PORK) LOCK FLUSH 100 UNIT/ML IV SOLN
INTRAVENOUS | Status: AC
  Administered 2018-05-25: 500 [IU] via INTRAVENOUS
  Filled 2018-05-25: qty 5

## 2018-05-25 MED ORDER — LIDOCAINE HCL (PF) 1 % IJ SOLN
INTRAMUSCULAR | Status: AC
  Filled 2018-05-25: qty 30

## 2018-05-25 MED ORDER — FENTANYL CITRATE (PF) 100 MCG/2ML IJ SOLN
INTRAMUSCULAR | Status: AC | PRN
  Administered 2018-05-25: 25 ug via INTRAVENOUS
  Administered 2018-05-25: 50 ug via INTRAVENOUS
  Administered 2018-05-25: 25 ug via INTRAVENOUS

## 2018-05-25 MED ORDER — HYDROCODONE-ACETAMINOPHEN 5-325 MG PO TABS: 1.0000 | ORAL_TABLET | ORAL | Status: DC | PRN

## 2018-05-25 MED ORDER — MIDAZOLAM HCL 2 MG/2ML IJ SOLN
INTRAMUSCULAR | Status: AC | PRN
  Administered 2018-05-25: 1 mg via INTRAVENOUS
  Administered 2018-05-25 (×2): 0.5 mg via INTRAVENOUS

## 2018-05-25 MED ORDER — SODIUM CHLORIDE 0.9% FLUSH: 10.0000 mL | INTRAVENOUS | Status: DC | PRN

## 2018-05-25 MED ORDER — SODIUM CHLORIDE 0.9 % IV SOLN
INTRAVENOUS | Status: AC | PRN
  Administered 2018-05-25: 10 mL/h via INTRAVENOUS

## 2018-05-25 NOTE — Discharge Instructions (Addendum)
Needle Biopsy, Care After This sheet gives you information about how to care for yourself after your procedure. Your health care provider may also give you more specific instructions. If you have problems or questions, contact your health care provider. What can I expect after the procedure? After the procedure, it is common to have soreness, bruising, or mild pain at the puncture site. This should go away in a few days. Follow these instructions at home: Needle insertion site care   Wash your hands with soap and water before you change your bandage (dressing). If you cannot use soap and water, use hand sanitizer.  Follow instructions from your health care provider about how to take care of your puncture site. This includes: ? When and how to change your dressing. ? When to remove your dressing.  Check your puncture site every day for signs of infection. Check for: ? Redness, swelling, or pain. ? Fluid or blood. ? Pus or a bad smell. ? Warmth. General instructions  Return to your normal activities as told by your health care provider. Ask your health care provider what activities are safe for you.  Do not take baths, swim, or use a hot tub until your health care provider approves. Ask your health care provider if you may take showers. You may only be allowed to take sponge baths.  Take over-the-counter and prescription medicines only as told by your health care provider.  Keep all follow-up visits as told by your health care provider. This is important. Contact a health care provider if:  You have a fever.  You have redness, swelling, or pain at the puncture site that lasts longer than a few days.  You have fluid, blood, or pus coming from your puncture site.  Your puncture site feels warm to the touch. Get help right away if:  You have severe bleeding from the puncture site. Summary  After the procedure, it is common to have soreness, bruising, or mild pain at the puncture  site. This should go away in a few days.  Check your puncture site every day for signs of infection, such as redness, swelling, or pain.  Get help right away if you have severe bleeding from your puncture site. This information is not intended to replace advice given to you by your health care provider. Make sure you discuss any questions you have with your health care provider. Document Released: 08/06/2014 Document Revised: 04/04/2017 Document Reviewed: 04/04/2017 Elsevier Interactive Patient Education  2019 [place]. Moderate Conscious Sedation, Adult, Care After These instructions provide you with information about caring for yourself after your procedure. Your health care provider may also give you more specific instructions. Your treatment has been planned according to current medical practices, but problems sometimes occur. Call your health care provider if you have any problems or questions after your procedure. What can I expect after the procedure? After your procedure, it is common:  To feel sleepy for several hours.  To feel clumsy and have poor balance for several hours.  To have poor judgment for several hours.  To vomit if you eat too soon. Follow these instructions at home: For at least 24 hours after the procedure:   Do not: ? Participate in activities where you could fall or become injured. ? Drive. ? Use heavy machinery. ? Drink alcohol. ? Take sleeping pills or medicines that cause drowsiness. ? Make important decisions or sign legal documents. ? Take care of children on your own.  Rest. Eating  and drinking  Follow the diet recommended by your health care provider.  If you vomit: ? Drink water, juice, or soup when you can drink without vomiting. ? Make sure you have little or no nausea before eating solid foods. General instructions  Have a responsible adult stay with you until you are awake and alert.  Take over-the-counter and prescription  medicines only as told by your health care provider.  If you smoke, do not smoke without supervision.  Keep all follow-up visits as told by your health care provider. This is important. Contact a health care provider if:  You keep feeling nauseous or you keep vomiting.  You feel light-headed.  You develop a rash.  You have a fever. Get help right away if:  You have trouble breathing. This information is not intended to replace advice given to you by your health care provider. Make sure you discuss any questions you have with your health care provider. Document Released: 01/10/2013 Document Revised: 08/25/2015 Document Reviewed: 07/12/2015 Elsevier Interactive Patient Education  2019 [employer].

## 2018-05-25 NOTE — Sedation Documentation (Signed)
Called to give report. Nurse unavailable. Will call back

## 2018-05-25 NOTE — Consult Note (Signed)
Chief Complaint: Patient was seen in consultation today for image guided biopsy of right submandibular lymph node  Referring Physician(s): Clarke-Pearson,D  Supervising Physician: Arne Cleveland  Patient Status: [facility] - Out-pt  History of Present Illness: Latoya Duarte is a 53 y.o. female with past hx breast cancer, s/p bilat mastectomies 2016 as well as recurrent ovarian cancer, initially diagnosed in 2014, s/p surgery and chemotherapy. Recent imaging has revealed enlargement/hypermetabolism of a right submandibular lymph node and pt presents today for image guided biopsy for further evaluation.   Past Medical History:  Diagnosis Date  . Abdominal bloating   . Abdominal pain   . BRCA1 genetic carrier   . Breast cancer ([place])   . Carcinoma in situ   . Chronic constipation   . Crohn's disease ([place])   . Diverticulitis   . Encounter for antineoplastic chemotherapy   . GERD (gastroesophageal reflux disease)   . Hepatomegaly   . IBS (irritable bowel syndrome)   . Malignant neoplasm of ovary ([place])   . Neutropenic fever ([place])   . Neutropenic fever ([place])   . Ovarian cancer genetic susceptibility   . Pelvic mass   . Sepsis [facility]     Past Surgical History:  Procedure Laterality Date  . COLONOSCOPY    . DILATION AND CURETTAGE OF UTERUS    . EXPLORATORY LAPAROTOMY    . HYSTERECTOMY ABDOMINAL WITH SALPINGECTOMY    . LYMPH NODE BIOPSY    . MASTECTOMY    . PORTACATH PLACEMENT    . RECONSTRUCTION / CORRECTION OF NIPPLE / AEROLA    . THORACOSCOPY    . TISSUE EXPANDER PLACEMENT    . TISSUE EXPANDER REMOVAL    . TUBAL LIGATION      Allergies: Ciprofloxacin; Latex; and Tape  Medications: Prior to Admission medications   Medication Sig Start Date End Date Taking? Authorizing Provider  escitalopram (LEXAPRO) 10 MG tablet Take 1 tablet (10 mg total) by mouth daily. 04/19/18  Yes Cross, Melissa D, NP  gabapentin (NEURONTIN) 300 MG capsule Take 1 capsule (300 mg total) by mouth 3  (three) times daily. 04/19/18  Yes Cross, Melissa D, NP  ibuprofen (ADVIL,MOTRIN) 600 MG tablet Take 1 tablet (600 mg total) by mouth every 6 (six) hours as needed for moderate pain. 05/16/18  Yes Cross, Melissa D, NP  lidocaine-prilocaine (EMLA) cream Apply 1 application topically daily as needed (port access).  06/25/15  Yes [provider]     Family History  Problem Relation Age of Onset  . Hypertension Mother   . Diabetes Father   . Hypertension Father   . Breast cancer Maternal Grandmother   . Ovarian cancer Paternal Aunt   . Lung cancer Paternal Grandfather   . Ovarian cancer Paternal Aunt   . Ovarian cancer Paternal Aunt   . Ovarian cancer Paternal Aunt   . Colon cancer Cousin   . Pancreatic cancer Cousin     Social History   Socioeconomic History  . Marital status: Married    Spouse name: Not on file  . Number of children: Not on file  . Years of education: Not on file  . Highest education level: Not on file  Occupational History  . Not on file  Social Needs  . Financial resource strain: Not on file  . Food insecurity:    Worry: Not on file    Inability: Not on file  . Transportation needs:    Medical: Not on file    Non-medical: Not on file  Tobacco Use  . Smoking status: Former Smoker    Packs/day: 1.00    Years: 5.00    Pack years: 5.00    Types: Cigarettes  . Smokeless tobacco: Never Used  Substance and Sexual Activity  . Alcohol use: Never    Frequency: Never  . Drug use: No  . Sexual activity: Not Currently    Birth control/protection: Surgical  Lifestyle  . Physical activity:    Days per week: Not on file    Minutes per session: Not on file  . Stress: Not on file  Relationships  . Social connections:    Talks on phone: Not on file    Gets together: Not on file    Attends religious service: Not on file    Active member of club or organization: Not on file    Attends meetings of clubs or organizations: Not on file    Relationship  status: Not on file  Other Topics Concern  . Not on file  Social History Narrative   ** Merged History Encounter **          Review of Systems denies fever,HA,CP,dyspnea, cough, abd pain,N/V or bleeding  Vital Signs: BP 119/77   Pulse 70   Temp 98.2 F (36.8 C) (Oral)   Resp 16   Ht 5' 4.5" (1.638 m)   Wt 170 lb (77.1 kg)   LMP 01/22/2011   SpO2 97%   BMI 28.73 kg/m   Physical Exam awake/alert; chest- CTA bilat; clean, intact rt chest port a cath;  heart- RRR; abd- soft,+BS,NT; no LE edema  Imaging: Nm Pet Image Restag (ps) Skull Base To Thigh  Addendum Date: 05/12/2018   ADDENDUM REPORT: 05/12/2018 15:43 ADDENDUM: A previous PET-CT from [facility] dated 07/08/2014 is now available on the PACS time line. Although available on PACs, this study is not available on the [employer] PET work station. Careful review of the CT data from the study dated almost 4 years ago reveals that the high posterior left cervical subcutaneous nodule was present on that earlier exam and measured 4 x 6 mm at that time, compatible with no interval change since it measures 4 x 7 mm on the current study. Given the long-term stability, the low level FDG uptake in this nodule currently may bebenign. Similarly, the 7 mm short axis lymph node in the lateral right thoracic wall just lateral to the breast prosthesis was also present 4 years ago and measured 7 mm in short axis at that time. Again, this long-term stability is reassuring for benign etiology. The 8 mm short axis right level IB lymph node identified on the current study was present previously but measured only about 4 mm short axis on the earlier study. Unlike the 2 nodules above, this lymph node demonstrates more substantial hypermetabolism ( SUV max = 5.4) and is therefore more concerning for metastatic disease. There was no evidence for hypermetabolic mediastinal lymph nodes on the previous PET-CT. Given that SUV max is in the 5-8 range for  these relatively small mediastinal nodes, metastatic disease remains a concern. The 9 mm ground-glass nodule in the anterior left upper lobe was not present previously and remains indeterminate. Electronically Signed   By: Misty Stanley M.D.   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 She is s/p XL, TAH/BSO staging/debulking in December, 2014. She completed 6 cycles of IV platinum/taxane  based chemotherapy in May, 2015. A PET CT performed for an increasing CA-125 in April, 2016 revealed recurrent disease in the right chest. She is s/p resection of this disease (portion of right 10 th rib) a. EXAM: NUCLEAR MEDICINE PET SKULL BASE TO THIGH TECHNIQUE: 9.6 mCi F-18 FDG was injected intravenously. Full-ring PET imaging was performed from the skull base to thigh after the radiotracer. CT data was obtained and used for attenuation correction and anatomic localization. Fasting blood glucose: 84 mg/dl COMPARISON:  None. FINDINGS: Mediastinal blood pool activity: SUV max 2.3 NECK: 7 x 4 mm subcutaneous nodule left paramidline posterior upper neck, at the level of the C1 posterior arch demonstrates low level hypermetabolism with SUV max = 2.5. 8 mm short axis right level IB lymph node demonstrates SUV max = 5.4. Symmetric uptake is identified in the tonsillar regions bilaterally. Incidental CT findings: none CHEST: 9 mm short axis subcarinal lymph node (60/1) is hypermetabolic with SUV max = 7.5. Adjacent 7 mm short axis right hilar node demonstrates SUV max = 5.5. There is a focus of hypermetabolism identified posterior to the SVC/RA junction although no CT correlate abnormality is evident. This may potentially represent activity trapped in a fibrin sheath related to the patient's Port-A-Cath tip which is in the same region. A subtle 9 mm ground-glass nodule in the anterior left upper  lobe (24/8) has discernible FDG accumulation with SUV max = 2.5. There is relatively long segment FDG accumulation in the distal esophagus without appreciable esophageal wall thickening by CT. 7 mm short axis lymph node in the lateral right chest wall (image 88/series 4) shows low level hypermetabolism with SUV max = 2. A band of FDG accumulation is identified in the posterolateral right chest wall, in the region of the posterior right tenth rib surgery site. Obtained may well be related to granulation/scarring from surgical resection, but close attention will be required as recurrent disease can not be excluded. Incidental CT findings: none ABDOMEN/PELVIS: No abnormal hypermetabolic activity within the liver, pancreas, adrenal glands, or spleen. No hypermetabolic lymph nodes in the abdomen or pelvis. FDG uptake is identified in the para-aortic region of the upper abdomen. This appears to correspond to duodenum and small bowel uptake is seen elsewhere in the abdomen and pelvis, not unexpected. No definite lymphadenopathy can be identified in the upper abdomen on noncontrast CT imaging. Incidental CT findings: none SKELETON: No focal hypermetabolic activity to suggest skeletal metastasis. Incidental CT findings: none IMPRESSION: 1. Hypermetabolic subcarinal and right hilar lymph nodes, concerning for metastatic disease. 2. Subcutaneous nodules or small lymph nodes identified in the subcutaneous fat of the upper posterior left paramidline neck (C1 level) and in the lateral right chest wall (just lateral to the right breast prosthesis) show FDG accumulation. Discernible uptake in these tiny nodules raises concern for metastatic disease. Comparison to prior imaging to assess chronicity recommended. 3. 9 mm anterior left upper lobe pulmonary nodule with low level hypermetabolism. This could potentially be infectious/inflammatory, but metastatic disease not excluded. 4. Uptake in the distal esophagus without esophageal  wall thickening. Esophagitis a consideration. 5. Bandlike area of FDG accumulation in the right lower chest wall, along the tenth rib resection site is probably related to the prior surgery. If the patient has had prior PET imaging since the surgery, comparison to that study recommended. Electronically Signed: By: Misty Stanley M.D. On: 05/11/2018 20:39    Labs:      CBC: Recent Labs    05/25/18 0558  WBC 8.2  HGB 14.3  HCT 45.0  PLT 265    COAGS: Recent Labs    05/25/18 0558  INR 1.00    BMP: No results for input(s): NA, K, CL, CO2, GLUCOSE, BUN, CALCIUM, CREATININE, GFRNONAA, GFRAA in the last 8760 hours.  Invalid input(s): CMP  LIVER FUNCTION TESTS: No results for input(s): BILITOT, AST, ALT, ALKPHOS, PROT, ALBUMIN in the last 8760 hours.  TUMOR MARKERS: No results for input(s): AFPTM, CEA, CA199, CHROMGRNA in the last 8760 hours.  Assessment and Plan: 53 y.o. female with past hx breast cancer, s/p bilat mastectomies 2016 as well as recurrent ovarian cancer, initially diagnosed in 2014, s/p surgery and chemotherapy. Recent imaging has revealed enlargement/hypermetabolism of a right submandibular lymph node and pt presents today for image guided biopsy for further evaluation.Risks and benefits of procedure was discussed with the patient and/or patient's family including, but not limited to bleeding, infection, damage to adjacent structures or low yield requiring additional tests.  All of the questions were answered and there is agreement to proceed.  Consent signed and in chart.     Thank you for this interesting consult.  I greatly enjoyed meeting Latoya Duarte and look forward to participating in their care.  A copy of this report was sent to the requesting provider on this date.  Electronically Signed: D. Rowe Robert, PA-C 05/25/2018, 7:44 AM   I spent a total of 25 minutes in face to face in clinical consultation, greater than 50% of which was  counseling/coordinating care for image guided biopsy of right submandibular lymph node

## 2018-05-26 ENCOUNTER — Encounter: Payer: Self-pay | Admitting: Gynecologic Oncology

## 2018-05-26 ENCOUNTER — Telehealth: Payer: Self-pay | Admitting: Gynecologic Oncology

## 2018-05-26 NOTE — Telephone Encounter (Signed)
Called patient and informed her of biopsy results.  Patient states she has a tooth that acts up on that side and she felt the lymph node may have been lighting up on that side due to that.  Advised her that I would contact her with Dr. Lunette Stands recommendations moving forward. No concerns voiced.  Advised to call for any needs or concerns.

## 2018-05-29 ENCOUNTER — Encounter: Payer: Self-pay | Admitting: Gynecologic Oncology

## 2018-05-29 ENCOUNTER — Encounter: Payer: Self-pay | Admitting: Oncology

## 2018-05-29 DIAGNOSIS — C569 Malignant neoplasm of unspecified ovary: Secondary | ICD-10-CM

## 2018-06-01 ENCOUNTER — Telehealth: Payer: Self-pay | Admitting: Oncology

## 2018-06-01 NOTE — Telephone Encounter (Signed)
Called [facility] to check on status of referral.  They said they are reviewing it.

## 2018-06-05 ENCOUNTER — Telehealth: Payer: Self-pay | Admitting: Oncology

## 2018-06-05 NOTE — Telephone Encounter (Signed)
Left a message for patient with apt to see Dr. Elsworth Soho on 06/09/18 at 10 am.  Requested a return call.

## 2018-06-06 ENCOUNTER — Encounter: Payer: Self-pay | Admitting: Oncology

## 2018-06-09 ENCOUNTER — Telehealth: Payer: Self-pay | Admitting: Oncology

## 2018-06-09 ENCOUNTER — Institutional Professional Consult (permissible substitution): Payer: 59 | Admitting: Pulmonary Disease

## 2018-06-09 NOTE — Telephone Encounter (Signed)
Left a message for Fa[place] regarding the canceled appointment with Dr. Elsworth Soho.  Requested a return call.

## 2018-06-12 ENCOUNTER — Inpatient Hospital Stay: Payer: 59 | Attending: Gynecologic Oncology

## 2018-07-05 ENCOUNTER — Other Ambulatory Visit: Payer: 59

## 2018-07-05 ENCOUNTER — Ambulatory Visit: Payer: 59 | Admitting: Gynecologic Oncology

## 2020-01-14 IMAGING — CT NM PET TUM IMG RESTAG (PS) SKULL BASE T - THIGH
8 series · 23 of 25 positions shown · non-contrast
Comparison: None.

Addendum:
CLINICAL DATA: Subsequent treatment strategy for recurrent,
platinum sensitive stage IIIC (+left para-aortic LN) serous ovarian
cancer. She is s/p XL, TAH/BSO staging/debulking in March 2013.
She completed 6 cycles of IV platinum/taxane based chemotherapy in
August 2013. A PET CT performed for an increasing P2-GNN in July 2014 revealed recurrent disease in the right chest. She is s/p
resection of this disease (portion of right 10 th rib) a..

EXAM:
NUCLEAR MEDICINE PET SKULL BASE TO THIGH
TECHNIQUE: 9.6 mCi F-18 FDG was injected intravenously. Full-ring PET imaging
was performed from the skull base to thigh after the radiotracer. CT
data was obtained and used for attenuation correction and anatomic
localization.
Fasting blood glucose: 84 mg/dl

[Series 3: pet sk_thigh ac · axial · 5.0mm · 4.07mm/px · z∈[-804,+128]mm · 5 of 234 slices shown]
[im 1/234]
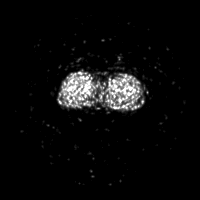
[im 59/234]
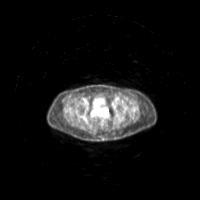
[im 117/234]
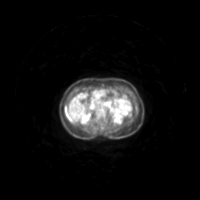
[im 175/234]
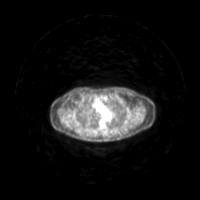
[im 234/234]
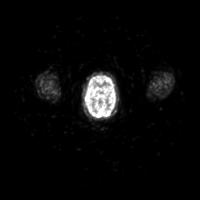

[Series 4: ct sk_thigh 5.0 b31f · axial · 5.0mm · 0.98mm/px · z∈[-804,+128]mm · 4 of 234 slices shown]
[im 1/234]
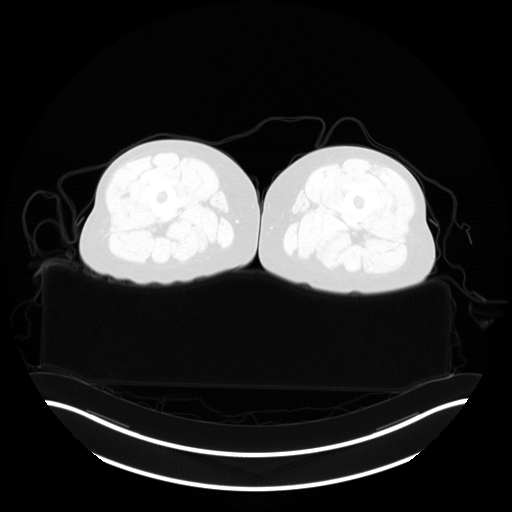
[im 117/234]
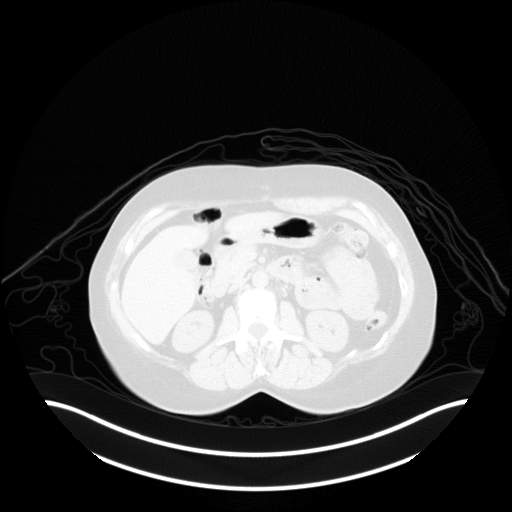
[im 175/234]
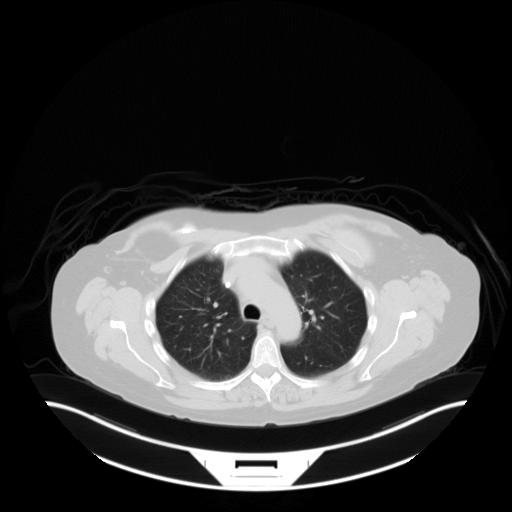
[im 234/234  brain]
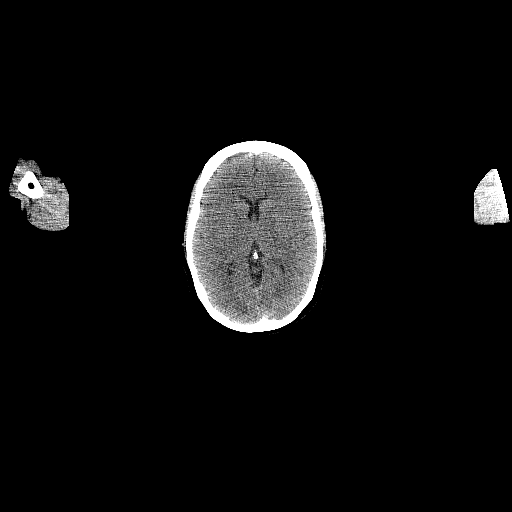

[Series 5: pet sk_thigh nac · axial · 5.0mm · 4.07mm/px · z∈[-804,+128]mm · 5 of 234 slices shown]
[im 1/234]
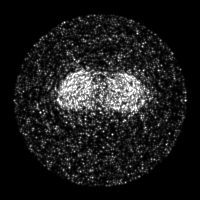
[im 59/234]
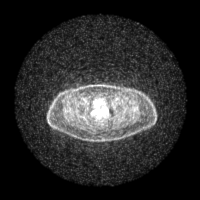
[im 117/234]
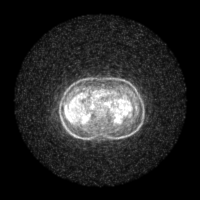
[im 175/234]
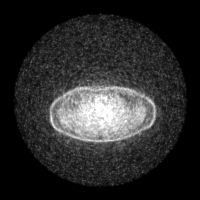
[im 234/234]
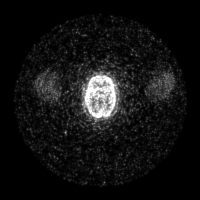

[Series 8: ct sk_thigh 5.0 b70f lung_bone// · axial · 5.0mm · 0.58mm/px · 1 of 61 slices shown]
[im 1/61  bone]
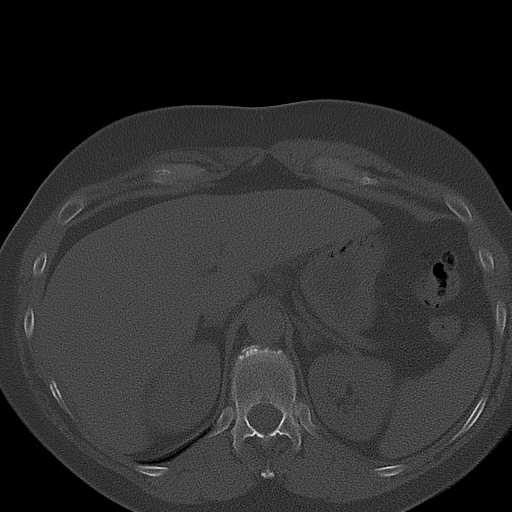

[Series 603: mip range · coronal · 1.93mm/px · 1 of 32 slices shown]
[im 1/32]
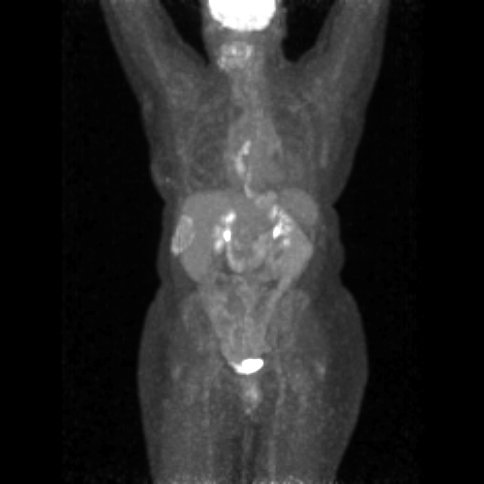

[Series 604: range-ct sk_thigh 5.0 (id)<alpha range> · 1 of 81 slices shown (1 of 2)]
[im 1/81]
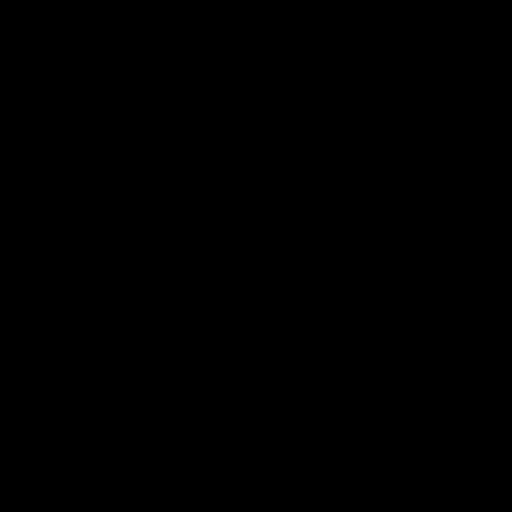

[Series 605: range-ct sk_thigh 5.0 (id)<alpha range> · 5 of 227 slices shown (2 of 2)]
[im 1/227]
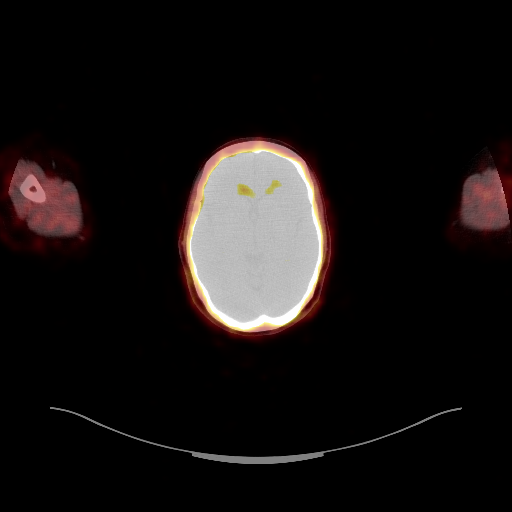
[im 57/227]
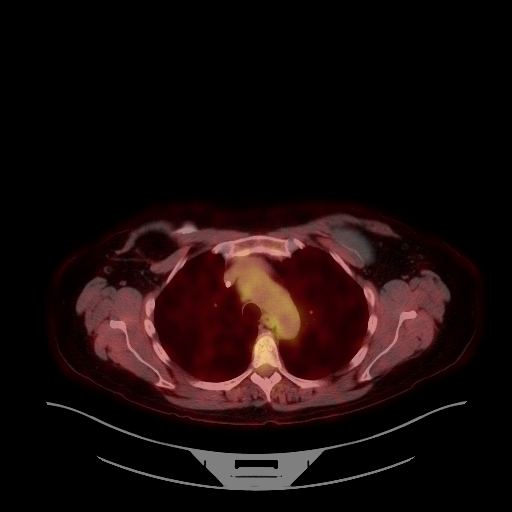
[im 114/227]
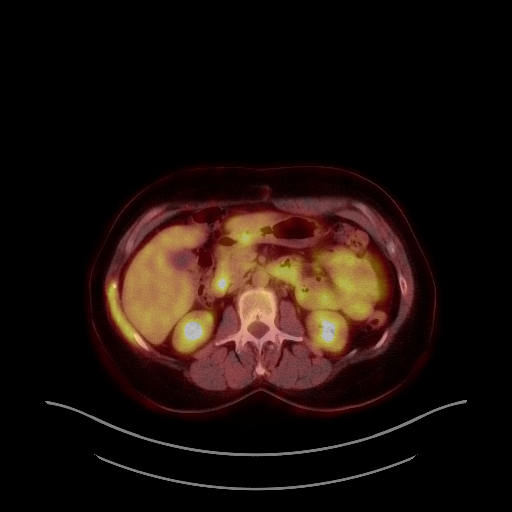
[im 170/227]
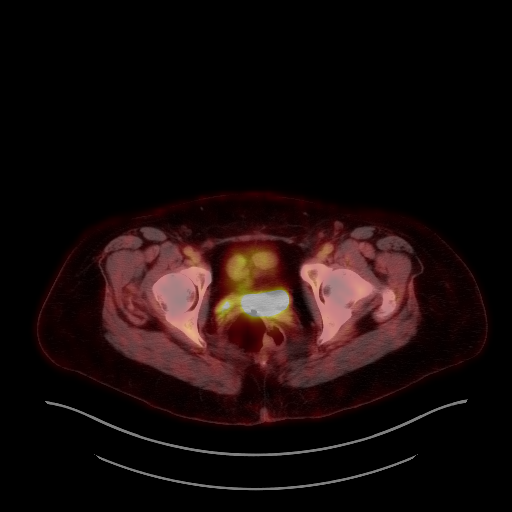
[im 227/227]
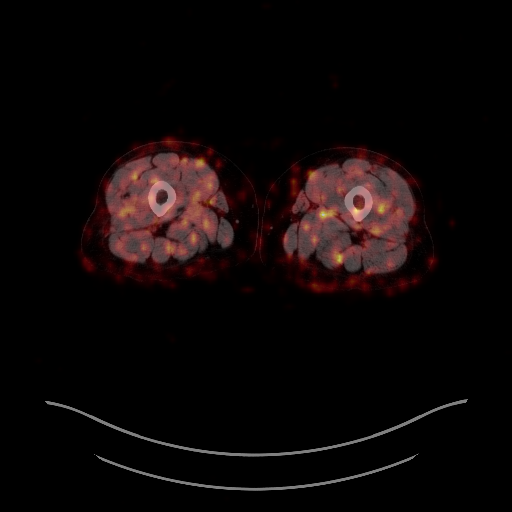

[Series 1080: results mm oncology reading · 4.0mm · 1.03mm/px · 1 of 6 slices shown]
[im 1/6]
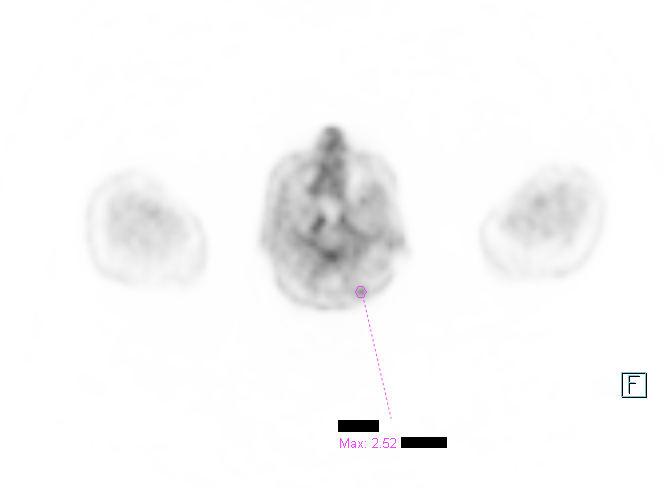

[23 of 25 positions shown; findings below may reference images not displayed]

FINDINGS: Mediastinal blood pool activity: SUV max

NECK: 7 x 4 mm subcutaneous nodule left paramidline posterior upper
neck, at the level of the C1 posterior arch demonstrates low level
hypermetabolism with SUV max = 2.5.

8 mm short axis right level IB lymph node demonstrates SUV max =
5.4.

Symmetric uptake is identified in the tonsillar regions bilaterally.

Incidental CT findings: none

CHEST: 9 mm short axis subcarinal lymph node (71/4) is
hypermetabolic with SUV max = 7.5. Adjacent 7 mm short axis right
hilar node demonstrates SUV max = 5.5.

There is a focus of hypermetabolism identified posterior to the
SVC/RA junction although no CT correlate abnormality is evident.
This may potentially represent activity trapped in a fibrin sheath
related to the patient's Port-A-Cath tip which is in the same
region.

A subtle 9 mm ground-glass nodule in the anterior left upper lobe
([DATE]) has discernible FDG accumulation with SUV max = 2.5.

There is relatively long segment FDG accumulation in the distal
esophagus without appreciable esophageal wall thickening by CT.

7 mm short axis lymph node in the lateral right chest wall (image
88/series 4) shows low level hypermetabolism with SUV max = 2.

A band of FDG accumulation is identified in the posterolateral right
chest wall, in the region of the posterior right tenth rib surgery
site. Obtained may well be related to granulation/scarring from
surgical resection, but close attention will be required as
recurrent disease can not be excluded..

Incidental CT findings: none

ABDOMEN/PELVIS: No abnormal hypermetabolic activity within the
liver, pancreas, adrenal glands, or spleen. No hypermetabolic lymph
nodes in the abdomen or pelvis.

FDG uptake is identified in the para-aortic region of the upper
abdomen. This appears to correspond to duodenum and small bowel
uptake is seen elsewhere in the abdomen and pelvis, not unexpected.
No definite lymphadenopathy can be identified in the upper abdomen
on noncontrast CT imaging.

Incidental CT findings: none

SKELETON: No focal hypermetabolic activity to suggest skeletal
metastasis.

Incidental CT findings: none
IMPRESSION: 1. Hypermetabolic subcarinal and right hilar lymph nodes, concerning
for metastatic disease.
2. Subcutaneous nodules or small lymph nodes identified in the
subcutaneous fat of the upper posterior left paramidline neck (C1
level) and in the lateral right chest wall (just lateral to the
right breast prosthesis) show FDG accumulation. Discernible uptake
in these tiny nodules raises concern for metastatic disease.
Comparison to prior imaging to assess chronicity recommended.
3. 9 mm anterior left upper lobe pulmonary nodule with low level
hypermetabolism. This could potentially be infectious/inflammatory,
but metastatic disease not excluded.
4. Uptake in the distal esophagus without esophageal wall
thickening. Esophagitis a consideration.
5. Bandlike area of FDG accumulation in the right lower chest wall,
along the tenth rib resection site is probably related to the prior
surgery. If the patient has had prior PET imaging since the surgery,
comparison to that study recommended.

ADDENDUM:
A previous PET-CT from [REDACTED] dated 07/08/2014 is
now available on the PACS time line. Although available on PACs,
this study is not available on the [employer] PET work station.

Careful review of the CT data from the study dated almost 4 years
ago reveals that the high posterior left cervical subcutaneous
nodule was present on that earlier exam and measured 4 x 6 mm at
that time, compatible with no interval change since it measures 4 x
7 mm on the current study. Given the long-term stability, the low
level FDG uptake in this nodule currently may bebenign.

Similarly, the 7 mm short axis lymph node in the lateral right
thoracic wall just lateral to the breast prosthesis was also present
4 years ago and measured 7 mm in short axis at that time. Again,
this long-term stability is reassuring for benign etiology.

The 8 mm short axis right level IB lymph node identified on the
current study was present previously but measured only about 4 mm
short axis on the earlier study. Unlike the 2 nodules above, this
lymph node demonstrates more substantial hypermetabolism ( SUV max =
5.4) and is therefore more concerning for metastatic disease.

There was no evidence for hypermetabolic mediastinal lymph nodes on
the previous PET-CT. Given that SUV max is in the 5-8 range for
these relatively small mediastinal nodes, metastatic disease remains
a concern.

The 9 mm ground-glass nodule in the anterior left upper lobe was not
present previously and remains indeterminate.

*** End of Addendum ***
